# Patient Record
Sex: Female | Born: 1970 | Race: Black or African American | Hispanic: No | Marital: Single | Smoking: Current every day smoker
Health system: Southern US, Community
[De-identification: ages and names within clinical notes are randomized; demographics above are authoritative.]

---

## 2019-06-22 ENCOUNTER — Encounter: Payer: Self-pay | Admitting: *Deleted

## 2019-06-22 ENCOUNTER — Other Ambulatory Visit: Payer: Self-pay

## 2019-06-22 DIAGNOSIS — R079 Chest pain, unspecified: Secondary | ICD-10-CM | POA: Insufficient documentation

## 2019-06-22 DIAGNOSIS — F17200 Nicotine dependence, unspecified, uncomplicated: Secondary | ICD-10-CM | POA: Insufficient documentation

## 2019-06-22 LAB — CBC
HCT: 35 % — ABNORMAL LOW (ref 36.0–46.0)
Hemoglobin: 11.4 g/dL — ABNORMAL LOW (ref 12.0–15.0)
MCH: 23.7 pg — ABNORMAL LOW (ref 26.0–34.0)
MCHC: 32.6 g/dL (ref 30.0–36.0)
MCV: 72.8 fL — ABNORMAL LOW (ref 80.0–100.0)
Platelets: 269 10*3/uL (ref 150–400)
RBC: 4.81 MIL/uL (ref 3.87–5.11)
RDW: 15.9 % — ABNORMAL HIGH (ref 11.5–15.5)
WBC: 8.8 10*3/uL (ref 4.0–10.5)
nRBC: 0 % (ref 0.0–0.2)

## 2019-06-22 LAB — BASIC METABOLIC PANEL
Anion gap: 9 (ref 5–15)
BUN: 6 mg/dL (ref 6–20)
CO2: 24 mmol/L (ref 22–32)
Calcium: 9.7 mg/dL (ref 8.9–10.3)
Chloride: 103 mmol/L (ref 98–111)
Creatinine, Ser: 0.85 mg/dL (ref 0.44–1.00)
GFR calc Af Amer: >60 mL/min (ref >60)
GFR calc non Af Amer: >60 mL/min (ref >60)
Glucose, Bld: 110 mg/dL — ABNORMAL HIGH (ref 70–99)
Potassium: 4.3 mmol/L (ref 3.5–5.1)
Sodium: 136 mmol/L (ref 135–145)

## 2019-06-22 LAB — TROPONIN I (HIGH SENSITIVITY): Troponin I (High Sensitivity): 6 ng/L (ref <18)

## 2019-06-22 LAB — POCT PREGNANCY, URINE: Preg Test, Ur: NEGATIVE

## 2019-06-22 MED ORDER — SODIUM CHLORIDE 0.9% FLUSH: 3.0000 mL | Freq: Once | INTRAVENOUS | Status: DC

## 2019-06-22 NOTE — ED Triage Notes (Signed)
Pt reports chest pressure in left chest.  No sob.  cig smoker.  No cough.  Nausea today  Pt alert  Speech clear.

## 2019-06-23 ENCOUNTER — Emergency Department: Payer: Self-pay

## 2019-06-23 ENCOUNTER — Emergency Department
Admission: EM | Admit: 2019-06-23 | Discharge: 2019-06-23 | Disposition: A | Discharge status: Home / Self Care | Payer: Self-pay | Attending: Emergency Medicine | Admitting: Emergency Medicine

## 2019-06-23 DIAGNOSIS — R079 Chest pain, unspecified: Secondary | ICD-10-CM

## 2019-06-23 LAB — TROPONIN I (HIGH SENSITIVITY): Troponin I (High Sensitivity): 5 ng/L (ref <18)

## 2019-06-23 MED ORDER — LIDOCAINE VISCOUS HCL 2 % MT SOLN
15.0000 mL | Freq: Once | OROMUCOSAL | Status: AC
  Administered 2019-06-23: 15 mL via ORAL
  Filled 2019-06-23: qty 15

## 2019-06-23 MED ORDER — ALUM & MAG HYDROXIDE-SIMETH 200-200-20 MG/5ML PO SUSP
30.0000 mL | Freq: Once | ORAL | Status: AC
  Administered 2019-06-23: 30 mL via ORAL
  Filled 2019-06-23: qty 30

## 2019-06-23 NOTE — ED Provider Notes (Signed)
[facility] Emergency Department Provider Note  Time seen: 12:43 AM  I have reviewed the triage vital signs and the nursing notes.   HISTORY  Chief Complaint Chest Pain   HPI Monica Sheppard is a 48 y.o. female with no past medical history presents to the emergency department for chest pressure.  According to the patient approximately 2 hours before arrival to the emergency department and she began experiencing pressure in the center of her chest.  Denies any nausea or diaphoresis but did state some mild shortness of breath when the chest discomfort started.  Denies any recent fever cough or congestion.  No abdominal pain vomiting or diarrhea.  Patient states the chest pressure has largely alleviated with very mild chest pressure remaining.  No history of heart disease, no family history of heart disease per patient.  Patient describes the chest pressure as moderate earlier today very minimal currently.   No past medical history on file.  There are no active problems to display for this patient.   Prior to Admission medications   Not on File    No Known Allergies  No family history on file.  Social History Social History   Tobacco Use  . Smoking status: Current Every Day Smoker  . Smokeless tobacco: Never Used  Substance Use Topics  . Alcohol use: Not Currently  . Drug use: Not Currently    Review of Systems Constitutional: Negative for fever. Cardiovascular: Chest pain earlier today, nearly resolved. Respiratory: Mild shortness of breath earlier tonight, none currently.  Negative for cough Gastrointestinal: Negative for abdominal pain, vomiting  Musculoskeletal: Negative for musculoskeletal complaints Skin: Negative for skin complaints  Neurological: Negative for headache All other ROS negative  ____________________________________________   PHYSICAL EXAM:  VITAL SIGNS: ED Triage Vitals  Enc Vitals Group     BP 06/22/19 2134 115/76   Pulse Rate 06/22/19 2134 82     Resp 06/22/19 2134 20     Temp 06/22/19 2134 99 F (37.2 C)     Temp Source 06/22/19 2134 Oral     SpO2 06/22/19 2134 100 %     Weight --      Height --      Head Circumference --      Peak Flow --      Pain Score 06/22/19 2128 8     Pain Loc --      Pain Edu? --      Excl. in GC? --    Constitutional: Alert and oriented. Well appearing and in no distress. Eyes: Normal exam ENT      Head: Normocephalic and atraumatic.      Mouth/Throat: Mucous membranes are moist. Cardiovascular: Normal rate, regular rhythm. No murmur Respiratory: Normal respiratory effort without tachypnea nor retractions. Breath sounds are clear Gastrointestinal: Soft and nontender. No distention.   Musculoskeletal: Nontender with normal range of motion in all extremities. Neurologic:  Normal speech and language. No gross focal neurologic deficits  Skin:  Skin is warm, dry and intact.  Psychiatric: Mood and affect are normal.  ____________________________________________    EKG  EKG viewed and interpreted by myself shows a normal sinus rhythm 85 bpm with a narrow QRS, normal axis, normal intervals, no concerning ST changes.  ____________________________________________    RADIOLOGY  Chest x-ray negative  ____________________________________________   INITIAL IMPRESSION / ASSESSMENT AND PLAN / ED COURSE  Pertinent labs & imaging results that were available during my care of the patient were reviewed by me and  considered in my medical decision making (see chart for details).   Patient presents to the emergency department for chest pain earlier tonight nearly resolved at this time.  Overall the patient appears well, sleeping comfortably.  We will dose a GI cocktail to see if it helps with the patient's minimal current symptoms.  Patient's initial lab work is largely [place].  Negative troponin.  Differential would include ACS, pneumonia, pneumothorax,  anxiety/stress, chest wall discomfort, gastric discomfort.  We will repeat a troponin.  If the patient's repeat troponin is negative anticipate likely discharge home.  Patient agreeable to plan of care.  Patient's repeat troponin remains negative.  Labs are reassuring.  Patient is feeling much better.  We will discharge with PCP follow-up.  Monica Sheppard was evaluated in Emergency Department on 06/23/2019 for the symptoms described in the history of present illness. She was evaluated in the context of the global COVID-19 pandemic, which necessitated consideration that the patient might be at risk for infection with the SARS-CoV-2 virus that causes COVID-19. Institutional protocols and algorithms that pertain to the evaluation of patients at risk for COVID-19 are in a state of rapid change based on information released by regulatory bodies including the CDC and federal and state organizations. These policies and algorithms were followed during the patient's care in the ED.  ____________________________________________   FINAL CLINICAL IMPRESSION(S) / ED DIAGNOSES  Chest pain   Harvest Dark, MD 06/23/19 [phone]

## 2019-11-27 IMAGING — CR CHEST - 2 VIEW
2 series · 2 of 2 positions shown · non-contrast
Comparison: None.

CLINICAL DATA: Chest pain

EXAM:
CHEST - 2 VIEW

[chest pa]
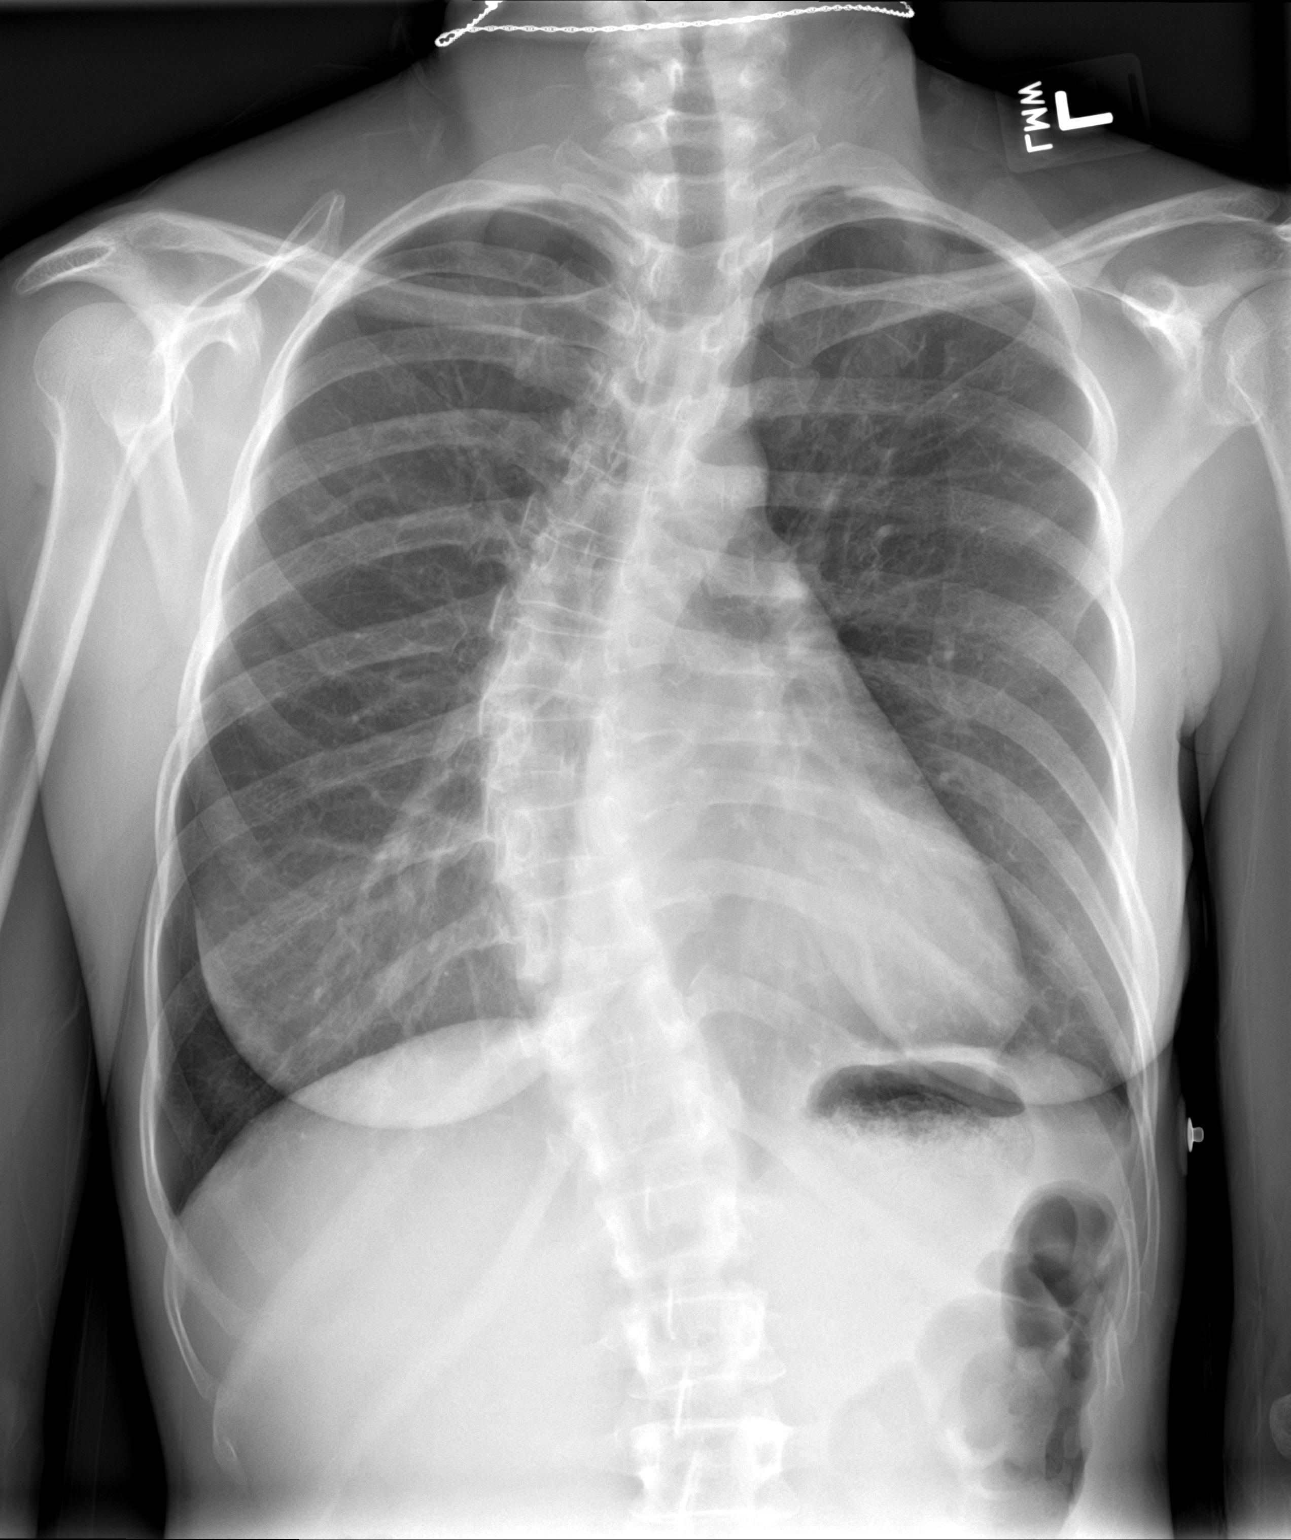

[chest lat]
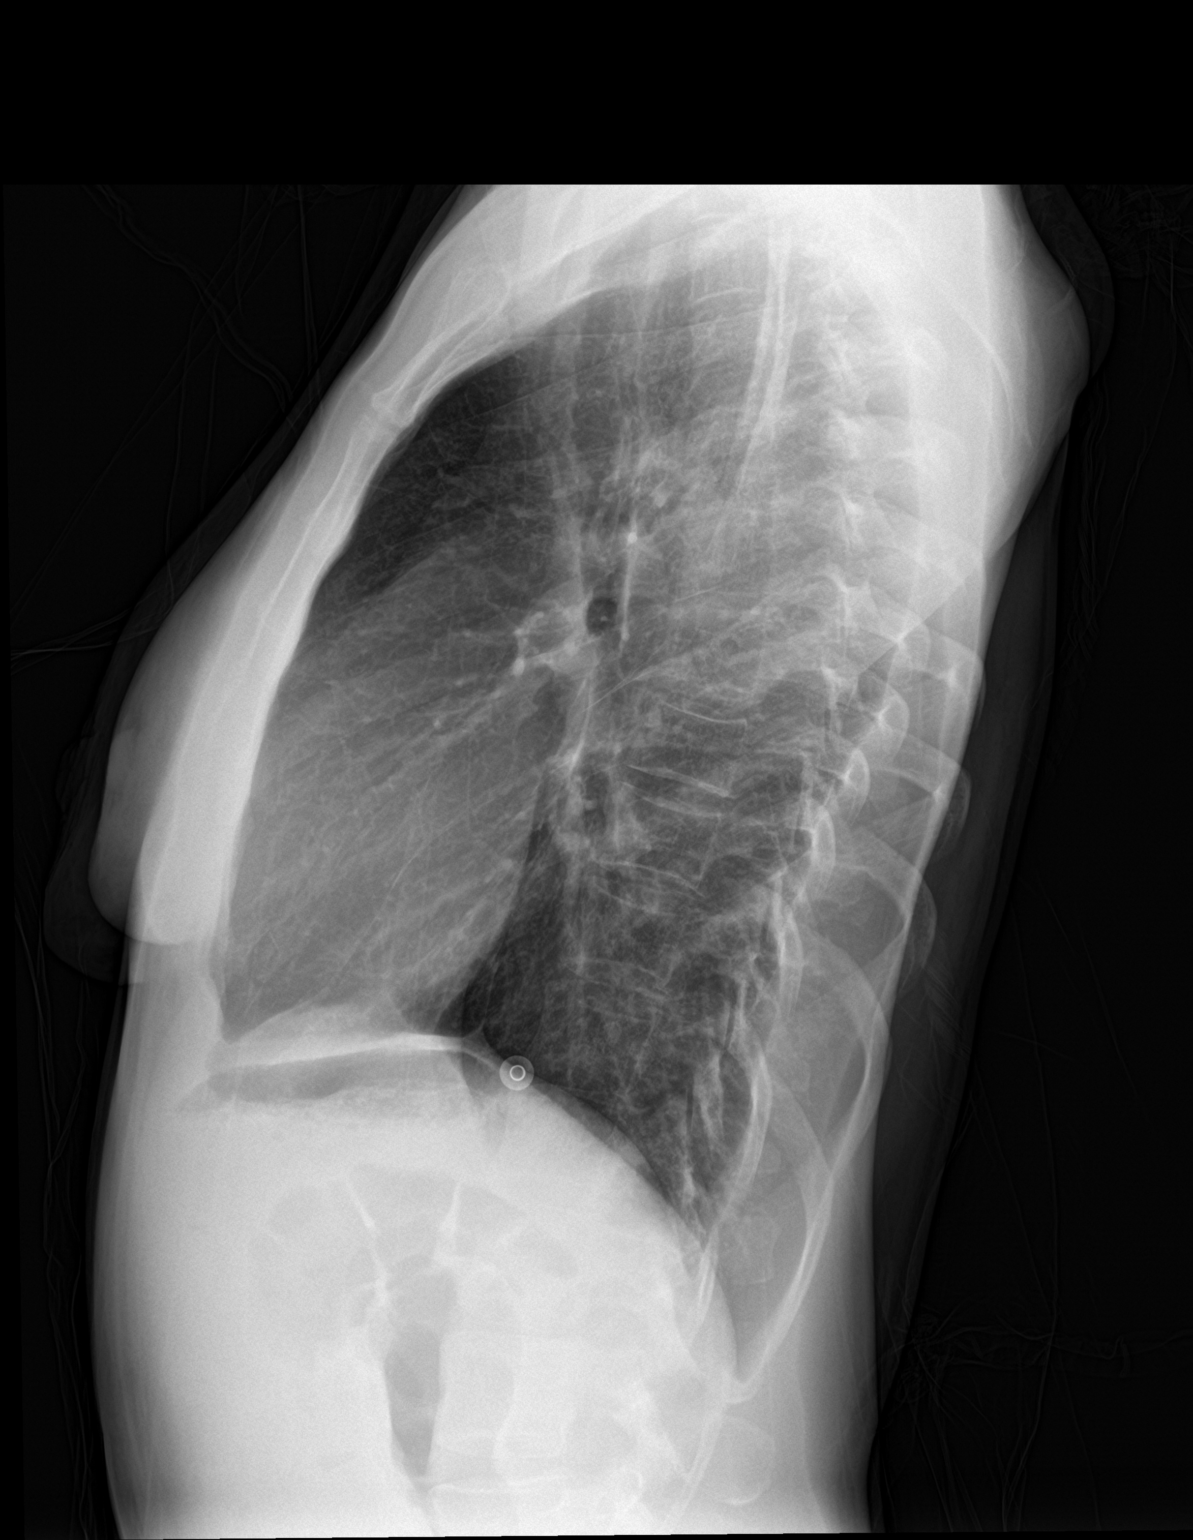

[2 of 2 positions shown; findings below may reference images not displayed]

FINDINGS: There is a rightward curvature of the midthoracic spine. The heart
size is borderline enlarged. There is no pneumothorax. No large
pleural effusion. There is no acute osseous abnormality.
IMPRESSION: No active cardiopulmonary disease.
